# Patient Record
Sex: Male | Born: 1950 | Race: White | Hispanic: No | Smoking: Former smoker
Health system: Southern US, Community
[De-identification: ages and names within clinical notes are randomized; demographics above are authoritative.]

## PROBLEM LIST (undated history)

## (undated) DIAGNOSIS — M549 Dorsalgia, unspecified: Secondary | ICD-10-CM

## (undated) DIAGNOSIS — E785 Hyperlipidemia, unspecified: Secondary | ICD-10-CM

## (undated) DIAGNOSIS — I10 Essential (primary) hypertension: Secondary | ICD-10-CM

## (undated) HISTORY — PX: NECK SURGERY: SHX720

## (undated) HISTORY — PX: SHOULDER ARTHROSCOPY: SHX128

## (undated) HISTORY — PX: SKIN GRAFT: SHX250

## (undated) HISTORY — PX: APPENDECTOMY: SHX54

---

## 2010-08-14 ENCOUNTER — Ambulatory Visit: Payer: Self-pay | Admitting: Family Medicine

## 2010-08-14 DIAGNOSIS — I10 Essential (primary) hypertension: Secondary | ICD-10-CM | POA: Insufficient documentation

## 2010-08-14 DIAGNOSIS — R079 Chest pain, unspecified: Secondary | ICD-10-CM | POA: Insufficient documentation

## 2010-08-14 LAB — CONVERTED CEMR LAB
ALT: 20 units/L (ref 0–53)
Albumin: 3.9 g/dL (ref 3.5–5.2)
CO2: 20 meq/L (ref 19–32)
Calcium: 9 mg/dL (ref 8.4–10.5)
Chloride: 106 meq/L (ref 96–112)
Glucose, Bld: 102 mg/dL — ABNORMAL HIGH (ref 70–99)
Potassium: 4.4 meq/L (ref 3.5–5.3)
Sodium: 138 meq/L (ref 135–145)
Total Protein: 6.3 g/dL (ref 6.0–8.3)

## 2010-08-16 ENCOUNTER — Encounter: Payer: Self-pay | Admitting: Family Medicine

## 2010-09-11 ENCOUNTER — Ambulatory Visit: Payer: Self-pay | Admitting: Emergency Medicine

## 2010-09-11 DIAGNOSIS — B029 Zoster without complications: Secondary | ICD-10-CM | POA: Insufficient documentation

## 2010-09-11 DIAGNOSIS — D492 Neoplasm of unspecified behavior of bone, soft tissue, and skin: Secondary | ICD-10-CM

## 2010-09-14 ENCOUNTER — Ambulatory Visit: Payer: Self-pay | Admitting: Family Medicine

## 2010-09-14 ENCOUNTER — Encounter: Admission: RE | Admit: 2010-09-14 | Discharge: 2010-09-14 | Payer: Self-pay | Admitting: Family Medicine

## 2010-09-14 DIAGNOSIS — M5137 Other intervertebral disc degeneration, lumbosacral region: Secondary | ICD-10-CM

## 2010-09-14 DIAGNOSIS — M5030 Other cervical disc degeneration, unspecified cervical region: Secondary | ICD-10-CM

## 2010-09-14 DIAGNOSIS — R112 Nausea with vomiting, unspecified: Secondary | ICD-10-CM | POA: Insufficient documentation

## 2010-09-14 DIAGNOSIS — R1011 Right upper quadrant pain: Secondary | ICD-10-CM

## 2010-09-15 DIAGNOSIS — N182 Chronic kidney disease, stage 2 (mild): Secondary | ICD-10-CM | POA: Insufficient documentation

## 2010-09-15 LAB — CONVERTED CEMR LAB
AST: 22 units/L (ref 0–37)
Albumin: 4.1 g/dL (ref 3.5–5.2)
Alkaline Phosphatase: 109 units/L (ref 39–117)
BUN: 31 mg/dL — ABNORMAL HIGH (ref 6–23)
Calcium: 9.3 mg/dL (ref 8.4–10.5)
Chloride: 103 meq/L (ref 96–112)
Eosinophils Absolute: 0.1 10*3/uL (ref 0.0–0.7)
Glucose, Bld: 113 mg/dL — ABNORMAL HIGH (ref 70–99)
Lipase: 21 units/L (ref 0–75)
Lymphs Abs: 1.6 10*3/uL (ref 0.7–4.0)
MCV: 92.9 fL (ref 78.0–100.0)
Monocytes Relative: 6 % (ref 3–12)
Neutro Abs: 6.1 10*3/uL (ref 1.7–7.7)
Neutrophils Relative %: 74 % (ref 43–77)
Platelets: 322 10*3/uL (ref 150–400)
Potassium: 4.8 meq/L (ref 3.5–5.3)
RBC: 4.35 M/uL (ref 4.22–5.81)
Sodium: 137 meq/L (ref 135–145)
Total Protein: 6.5 g/dL (ref 6.0–8.3)
WBC: 8.3 10*3/uL (ref 4.0–10.5)

## 2010-09-17 ENCOUNTER — Telehealth (INDEPENDENT_AMBULATORY_CARE_PROVIDER_SITE_OTHER): Payer: Self-pay

## 2010-09-25 ENCOUNTER — Ambulatory Visit: Payer: Self-pay | Admitting: Family Medicine

## 2010-09-25 DIAGNOSIS — T887XXA Unspecified adverse effect of drug or medicament, initial encounter: Secondary | ICD-10-CM | POA: Insufficient documentation

## 2010-09-28 ENCOUNTER — Encounter: Payer: Self-pay | Admitting: Family Medicine

## 2010-11-17 ENCOUNTER — Ambulatory Visit: Payer: Self-pay | Admitting: Family Medicine

## 2011-01-05 ENCOUNTER — Telehealth: Payer: Self-pay | Admitting: Family Medicine

## 2011-01-09 NOTE — Assessment & Plan Note (Signed)
Summary: Followup Call  Discussed lab results with patient.  Reports not much change, but no new symptoms. Advised to finish Valtrex and NSAID. Call for worsening symptoms. Donna Christen MD  August 16, 2010 11:19 AM

## 2011-01-09 NOTE — Assessment & Plan Note (Signed)
Summary: RASH ON L BUTTOCK/WB   Vital Signs:  Patient Profile:   60 Years Old Male CC:      Rash on left buttock x 1 week Height:     68.5 inches O2 Sat:      97 % O2 treatment:    Room Air Temp:     99.0 degrees F oral Pulse rate:   69 / minute Pulse rhythm:   regular Resp:     12 per minute BP sitting:   97 / 60  (left arm) Cuff size:   regular  Vitals Entered By: Emilio Math (September 11, 2010 8:14 AM)                  Current Allergies (reviewed today): No known allergies History of Present Illness Chief Complaint: Rash on left buttock x 1 week History of Present Illness: 1) Rash of L buttock for 1 week, perhaps getting worse last few days.  Was treated 1 month ago for Shingles for R flank.  This rash is slightly painful.  No urinary or bowel problems.  Tender to touch.  Only located on buttock.  No other symptoms. 2) Still c/o R flank swelling, not better s/p Valtrex. He thinks it is getting larger.  Not painful unless you press on it.  No other symptoms, no fever, night sweats, or weight loss.  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  He sees the [facility] but is having a hard time getting appts and things are taking months to happen.  He feels he is getting worse with pain in his neck and both arms.  It has been going on for years.  Current Meds PROSCAR 5 MG TABS (FINASTERIDE)  LISINOPRIL 40 MG TABS (LISINOPRIL)  FLOMAX 0.4 MG CAPS (TAMSULOSIN HCL)  HYDROCODONE-ACETAMINOPHEN 2.5-500 MG TABS (HYDROCODONE-ACETAMINOPHEN) prn BACTRIM DS 800-160 MG TABS (SULFAMETHOXAZOLE-TRIMETHOPRIM) 1 tab by mouth two times a day for 10 days VALTREX 1 GM TABS (VALACYCLOVIR HCL) 1 tab by mouth three times a day for 7 days  REVIEW OF SYSTEMS Constitutional Symptoms      Denies fever, chills, night sweats, weight loss, weight gain, and fatigue.  Eyes       Denies change in vision, eye pain, eye discharge, glasses, contact lenses, and eye  surgery. Ear/Nose/Throat/Mouth       Denies hearing loss/aids, change in hearing, ear pain, ear discharge, dizziness, frequent runny nose, frequent nose bleeds, sinus problems, sore throat, hoarseness, and tooth pain or bleeding.  Respiratory       Denies dry cough, productive cough, wheezing, shortness of breath, asthma, bronchitis, and emphysema/COPD.  Cardiovascular       Denies murmurs, chest pain, and tires easily with exhertion.    Gastrointestinal       Denies stomach pain, nausea/vomiting, diarrhea, constipation, blood in bowel movements, and indigestion. Genitourniary       Denies painful urination, kidney stones, and loss of urinary control. Neurological       Denies paralysis, seizures, and fainting/blackouts. Musculoskeletal       Denies muscle pain, joint pain, joint stiffness, decreased range of motion, redness, swelling, muscle weakness, and gout.  Skin       Denies bruising, unusual mles/lumps or sores, and hair/skin or nail changes.  Psych       Denies mood changes, temper/anger issues, anxiety/stress, speech problems, depression, and sleep problems.  Past History:  Past Medical History:  Reviewed history from 08/14/2010 and no changes required. Hypertension Chronic pain- patient visits pain specialist and receives tri weekly injections starting about 9 weeks ago  Past Surgical History: Reviewed history from 08/14/2010 and no changes required. 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 mildly tender, no fluctuance, mild induration felt, no drainage seen Assessment New Problems: NEOPLASMS UNSPEC NATURE BONE SOFT TISSUE&SKIN (ICD-239.2) HERPES ZOSTER WITHOUT MENTION OF COMPLICATION (ICD-053.9)  1) possible herpes zoster left S2 dermatome 2) lipoma vs spigelian hernia 3) chronic pain  Plan New Medications/Changes: VALTREX 1 GM TABS (VALACYCLOVIR HCL) 1 tab by mouth three times a day for 7 days  #21 x 0, 09/11/2010, Hoyt Koch MD BACTRIM DS 800-160 MG TABS (SULFAMETHOXAZOLE-TRIMETHOPRIM) 1 tab by mouth two times a day for 10 days  #20 x 0, 09/11/2010, Hoyt Koch MD  New Orders: Est. Patient Level III [phone] Planning Comments:   1) Valtrex, continue his current meds for pain.  Added an antibiotic also.  Try sitting on heating pad a couple times a day for a few days.  Follow-up with your primary care physician if not improving or if getting worse  2) refer to general surgery to eval the possible lipoma because it's getting larger 3) refer to PCP since [facility] seems that [facility] and his problems are getting worse.  For his chronic pain, has already had MRI's and EMG/NCV studies which he reports were abnormal.  However was also prev suggested that he see a rheumatologist.  So I'm unsure if he needs rheumatology and/or neurology to further eval his neck and shoulder pain   The patient and/or caregiver has been counseled thoroughly with regard to medications prescribed including dosage, schedule, interactions, rationale for use, and possible side effects and they verbalize understanding.  Diagnoses and expected course of  recovery discussed and will return if not improved as expected or if the condition worsens. Patient and/or caregiver verbalized understanding.  Prescriptions: VALTREX 1 GM TABS (VALACYCLOVIR HCL) 1 tab by mouth three times a day for 7 days  #21 x 0   Entered and Authorized by:   Hoyt Koch MD   Signed by:   Hoyt Koch MD on 09/11/2010   Method used:   Print then Give to Patient   RxID:   [id number] BACTRIM DS 800-160 MG TABS (SULFAMETHOXAZOLE-TRIMETHOPRIM) 1 tab by mouth two times a day for 10 days  #20 x 0   Entered and Authorized by:   Hoyt Koch MD   Signed by:   Hoyt Koch MD on 09/11/2010   Method used:   Print then Give to Patient   RxID:   [id number]   Orders Added: 1)  Est. Patient Level III [40102]

## 2011-01-09 NOTE — Assessment & Plan Note (Signed)
Summary: NOV chronic pain   Vital Signs:  Patient profile:   60 year old male Height:      67 inches Weight:      165 pounds BMI:     25.94 O2 Sat:      94 % on Room air Temp:     98.1 degrees F oral Pulse rate:   77 / minute BP sitting:   95 / 66  (left arm) Cuff size:   regular  Vitals Entered By: [name] (September 14, 2010 9:59 AM)  O2 Flow:  Room air CC: New to est. F/U ED. C/O Arm pain, knot on R side and rash on bottom.   Primary Care Provider:  [name]  CC:  New to est. F/U ED. C/O Arm pain and knot on R side and rash on bottom.Marland Kitchen  History of Present Illness: 60 yo WM presents for NOV.  He has been going to the [facility] mostly in [place].  He has hx of HTN.  He has chronic pain in his neck and back with carpal tunnel syndrome.  He has had  an MRI of his neck and nerve conduction testing.  He went to the pain clinic - [facility]  and had injections done in  the back of his head, neck and lower back but he did not get any pain relief.  He takes Oxycodone but quit everything else.    He has GERD, on Omeprazole.  He had to go the the ED on Monday for the worsening pain in his head, neck, arms and lower back that was not improving with his meds.  He has had extensive back and neck surgery in the past.  Denies any hx of trauma.  He also has HTN and BPH. He c/o Nausea and RUQ with a 'mass' under the skin in the RUQ.     Current Medications (verified): 1)  Proscar 5 Mg Tabs (Finasteride) .... Take 1 Tab By Mouth Once Daily 2)  Lisinopril 40 Mg Tabs (Lisinopril) .... Take 1 Tab By Mouth Once Daily 3)  Flomax 0.4 Mg Caps (Tamsulosin Hcl) 4)  Oxycodone Hcl 5 Mg Tabs (Oxycodone Hcl) .... Take 1 Tab By Mouth As Needed Pain 5)  Promethazine Hcl 25 Mg Tabs (Promethazine Hcl) .... Take As Needed Nausea 6)  Diclofenac Sodium 75 Mg Tbec (Diclofenac Sodium) 7)  Terazosin Hcl 2 Mg Caps (Terazosin Hcl) 8)  Omeprazole 20 Mg Cpdr (Omeprazole) .... Take 1 Tab By Mouth Once  Daily 9)  Fish Oil 1000 Mg Caps (Omega-3 Fatty Acids) 10)  Aspirin 81 Mg Tbec (Aspirin)  Allergies (verified): No Known Drug Allergies  Past History:  Past Medical History: Hypertension Chronic pain- patient visits pain specialist and receives tri weekly injections starting about 9 weeks ago BPH sees the [facility]  Past Surgical History: Reviewed history from 08/14/2010 and no changes required. 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  Review of Systems       no fevers/sweats/+weakness, unexplained wt loss/gain, no change in vision, no difficulty hearing, ringing in ears, no hay fever/allergies, no CP/discomfort, no palpitations, no breast lump/nipple discharge, no cough/wheeze, no blood in stool, no N/V/D, + nocturia, no leaking urine, no unusual vag bleeding, no vaginal/penile discharge, no muscle/joint pain, no rash, no new/changing mole, no HA, no memory loss, no anxiety, no sleep problem, no depression, no  unexplained lumps, no easy bruising/bleeding, no concern with sexual function   Physical Exam  General:  alert and well-hydrated.   Head:  normocephalic and atraumatic.   Eyes:  sclera non icteric Mouth:  poor dentition.   Neck:  globally reduced ROM Lungs:  prolonged exp phase, nonlabored, clear Heart:  normal rate and regular rhythm.   Abdomen:  RUQ tenderness with vol guarding; possible lipoma in the RUQ Msk:  full active L and T spine ROM tender paraspinal muscles Extremities:  no LE or UE edema Neurologic:  gait normal.   Skin:  color normal.  no jaundice Psych:  depressed affect.     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  Will talk to [facility] in [place] about resuming his RX narcotics given his hx.  he has appt with them next wk.  will set him up with chiropractor for  spinal decompression as adjunctive care. Orders: Chiropractic Referral (Chiro)  Problem # 2:  DISC DISEASE, LUMBAR (ICD-722.52) As per #1.  Will work on keeping him out of the ED for pain meds. Orders: Chiropractic Referral (Chiro)  Problem # 3:  RUQ PAIN (ICD-789.01) Possible painful lipoma vs cholecystitis, hepatitis or pancreatitis given symptom of nausea.  Will get RUQ u/s and labs today. Orders: T-Ultrasound Abdominal, Complete (16109) T-Comprehensive Metabolic Panel ([id number])  Problem # 4:  NAUSEA WITH VOMITING (ICD-787.01) Labs today.  Will need to see his old records and GI notes. Orders: T-CBC w/Diff [id number]) T-Lipase [id number]) T-Amylase [id number])  Complete Medication List: 1)  Proscar 5 Mg Tabs (Finasteride) .... Take 1 tab by mouth once daily 2)  Lisinopril 40 Mg Tabs (Lisinopril) .... 1/2 tab by mouth daily 3)  Flomax 0.4 Mg Caps (Tamsulosin hcl) 4)  Oxycodone Hcl 5 Mg Tabs (Oxycodone hcl) .... Take 1 tab by mouth as needed pain 5)  Promethazine Hcl 25 Mg Tabs (Promethazine hcl) .... Take as needed nausea 6)  Diclofenac Sodium 75 Mg Tbec (Diclofenac sodium) 7)  Terazosin Hcl 2 Mg Caps (Terazosin hcl) 8)  Omeprazole 20 Mg Cpdr (Omeprazole) .... Take 1 tab by mouth once daily 9)  Fish Oil 1000 Mg Caps (Omega-3 fatty acids) 10)  Aspirin 81 Mg Tbec (Aspirin)  Patient Instructions: 1)  Start on Cymbalta for both mood and chronic pain-- 30 mg once daily x 1 wk then go up to 60 mg/ day.  Call if any problems. 2)  Stay on current pain meds. 3)  I will get you into your pain clinic for med managment. 4)  Cut your Lisinopril in 1/2.  Your BP is a little too low. 5)  Referral made to Dr Laurine Blazer for spinal decompression. 6)  RUQ u/s order for RUQ pain. 7)  Labs downstairs today. 8)  Will call you w/ all results. 9)  Return for f/u in 1 month.  Appended Document: NOV chronic pain append:  added Cymbalta 30 mg/ day x 7 days then increase to 60 mg/ day as  an adjunct for chronic pain syndrome.  Seymour Bars, D.O.

## 2011-01-09 NOTE — Assessment & Plan Note (Signed)
Summary: rash/ throat swelling   Vital Signs:  Patient profile:   60 year old Kenneth Gray Height:      67 inches Weight:      167 pounds BMI:     26.25 O2 Sat:      96 % on Room air Pulse rate:   89 / minute BP sitting:   112 / 80  (left arm) Cuff size:   regular  Vitals Entered By: [name] (September 25, 2010 1:21 PM)  O2 Flow:  Room air CC: Rash on arms x 1 week. Burns and itches.    Primary Care [name]:  [name]  CC:  Rash on arms x 1 week. Burns and itches. .  History of Present Illness: 60 yo WM presents for an itchy rash on his arms x 1 wk.  He has tried oral benadryl and topical cortisone but it's not helping.  He feels like his throat is 'swollen'.  He was started on Cymbalta recently.  This is the only new medicine.  He has been to the pain clinic since our last visit.  No new meds were added.  he told me that they wanted him to see psych.    Allergies (verified): 1)  ! Cymbalta (Duloxetine Hcl)  Past History:  Past Medical History: Reviewed history from 09/14/2010 and no changes required. Hypertension Chronic pain- patient visits pain specialist and receives tri weekly injections starting about 9 weeks ago BPH sees the [place]  Social History: Reviewed history from 09/14/2010 and no changes required. 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   Mouth:  able to swallow.  No tongue edema.   Lungs:  no wheezing Heart:  normal rate, regular rhythm, and no murmur.   Extremities:  no UE or LE edema Skin:  excoriations with purpura along forearms Cervical Nodes:  No lymphadenopathy noted Psych:  flat affect.     Impression & Recommendations:  Problem # 1:  ADVERSE DRUG REACTION (ICD-995.20) Probable adverse reaction to Cymbalta start with sensation of throat swelling, pruritis  and rash. Wean off cymbalta - 30 mg caps given, take one a day x 3 days then stop. Treat itchy rash on arms with triamcinolone ointment x 10 days. Avoid scratching.  Take Benadryl 25 mg q 6 hrs x 5 days. f/u with psych.  Complete Medication List: 1)  Proscar 5 Mg Tabs (Finasteride) .... Take 1 tab by mouth once daily 2)  Lisinopril 40 Mg Tabs (Lisinopril) .... 1/2 tab by mouth daily 3)  Flomax 0.4 Mg Caps (Tamsulosin hcl) 4)  Oxycodone Hcl 5 Mg Tabs (Oxycodone hcl) .... Take 1 tab by mouth as needed pain 5)  Promethazine Hcl 25 Mg Tabs (Promethazine hcl) .... Take as needed nausea 6)  Diclofenac Sodium 75 Mg Tbec (Diclofenac sodium) 7)  Terazosin Hcl 2 Mg Caps (Terazosin hcl) 8)  Omeprazole 20 Mg Cpdr (Omeprazole) .... Take 1 tab by mouth once daily 9)  Fish Oil 1000 Mg Caps (Omega-3 fatty acids) 10)  Aspirin 81 Mg Tbec (Aspirin) 11)  Triamcinolone Acetonide 0.1 % Oint (Triamcinolone acetonide) .... Apply to rash on forearms two times a day x 7 days  Patient Instructions: 1)  Wean off Cymbalta - take 30 mg once daily x 3 days then STOP. 2)  Use OTC Benadryl 25 mg every  6 hrs x 5 days for allergic reaction. 3)  Use Triamcinolone ointment on forearms 2 x a day x 7-10 days.   4)  Make appt with psychiatry. 5)  Call if throat irritation and rash has not improved in 10 days. Prescriptions: TRIAMCINOLONE ACETONIDE 0.1 % OINT (TRIAMCINOLONE ACETONIDE) apply to rash on forearms two times a day x 7 days  #1 tube x 0   Entered and Authorized by:   [name]   Signed by:   [name] on 09/25/2010   Method used:   Electronically to        [facility] [phone]* (retail)       [address] [place], [place]  [address]       Ph: [phone]       Fax: [phone]   RxID:   [id number]    Orders Added: 1)  Est. Patient Level III [56433]

## 2011-01-09 NOTE — Assessment & Plan Note (Signed)
Summary: f/u bP   Vital Signs:  Patient profile:   60 year old male Height:      67 inches Weight:      179 pounds BMI:     28.14 Pulse rate:   72 / minute BP sitting:   106 / 64  (left arm) Cuff size:   regular  Vitals Entered By: [name] (November 17, 2010 8:15 AM) CC: F/U BP. Increased lisinopril to full tab   Primary Care Provider:  [name]  CC:  F/U BP. Increased lisinopril to full tab.  History of Present Illness: 60 yo WM presents for f/u HTN.  his last Cr was 1.5 and he did see Dr Charlies Silvers and had a renal u/s done.  He did not have any obstruction.  He did stop his Diclofenac.  He is seeing pain mgmt and is looking into getting a tens unit.  His Oxycodone was increased.  He reallizes that he cannot go back to work.  He is looking into getting a nerve stimulator.  He was monitoring BPs at home on 1/2 tab of Lisinopril and it was 150/90, so he went up to a full 40 mg tab.  Denies CP or DOE.  Denies leg edema or blurry vision.  He is seeing a chiropractor which has helped and is looking into adding massage and accupuncture.  Current Medications (verified): 1)  Proscar 5 Mg Tabs (Finasteride) .... Take 1 Tab By Mouth Once Daily 2)  Lisinopril 40 Mg Tabs (Lisinopril) .... Take 1 Tab By Mouth Once Daily 3)  Flomax 0.4 Mg Caps (Tamsulosin Hcl) 4)  Oxycodone Hcl 5 Mg Tabs (Oxycodone Hcl) .... Take 1 Tab By Mouth As Needed Pain 5)  Promethazine Hcl 25 Mg Tabs (Promethazine Hcl) .... Take As Needed Nausea 6)  Omeprazole 20 Mg Cpdr (Omeprazole) .... Take 1 Tab By Mouth Once Daily 7)  Fish Oil 1000 Mg Caps (Omega-3 Fatty Acids) 8)  Aspirin 81 Mg Tbec (Aspirin)  Allergies (verified): 1)  ! Cymbalta (Duloxetine Hcl)  Past History:  Past Medical History: Reviewed history from 09/14/2010 and no changes required. Hypertension Chronic pain- patient visits pain specialist and receives tri weekly injections starting about 9 weeks ago BPH sees the [place]  Past  Surgical History: Reviewed history from 08/14/2010 and no changes required. 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   Head:  normocephalic and atraumatic.   Eyes:  pupils equal, pupils round, and pupils reactive to light.   Mouth:  pharynx pink and moist.   Neck:  no masses.   Lungs:  Normal respiratory effort, chest expands symmetrically. Lungs are clear to auscultation, no crackles or wheezes. Heart:  normal rate, regular rhythm, and no murmur.   Extremities:  no LE edema Neurologic:  gait normal.   Skin:  color normal.   Psych:  good eye contact, not anxious appearing, and not depressed appearing.     Impression & Recommendations:  Problem # 1:  HYPERTENSION (ICD-401.9) BP looks good on Lisinopril.  Will continue.  Plan recheck BMP in 2 mos (he has appt with Dr Charlies Silvers in 2 mos). The following medications were removed from the medication list:    Terazosin  Hcl 2 Mg Caps (Terazosin hcl) His updated medication list for this problem includes:    Lisinopril 40 Mg Tabs (Lisinopril) .Marland Kitchen... 1 tab by mouth daily  BP today: 106/64 Prior BP: 112/80 (09/25/2010)  Labs Reviewed: K+: 4.8 (09/14/2010) Creat: : 1.53 (09/14/2010)     Problem # 2:  CHRONIC KIDNEY DISEASE STAGE II (MILD) (ICD-585.2) I reviewed his notes from Dr Charlies Silvers.  He is off NSAIDs but is now back on ACEi.  Will recheck his BMP in 8 wks.    Complete Medication List: 1)  Proscar 5 Mg Tabs (Finasteride) .... Take 1 tab by mouth once daily 2)  Lisinopril 40 Mg Tabs (Lisinopril) .Marland Kitchen.. 1 tab by mouth daily 3)  Flomax 0.4 Mg Caps (Tamsulosin hcl) 4)  Oxycodone Hcl 5 Mg Tabs (Oxycodone hcl) .... Take 1 tab  by mouth as needed pain 5)  Promethazine Hcl 25 Mg Tabs (Promethazine hcl) .... Take as needed nausea 6)  Omeprazole 20 Mg Cpdr (Omeprazole) .... Take 1 tab by mouth once daily 7)  Fish Oil 1000 Mg Caps (Omega-3 fatty acids) 8)  Aspirin 81 Mg Tbec (Aspirin) Prescriptions: LISINOPRIL 40 MG TABS (LISINOPRIL) 1 tab by mouth daily  #30 x 5   Entered and Authorized by:   [name]   Signed by:   [name] on 11/17/2010   Method used:   Electronically to        [facility] [phone]* (retail)       [address] [place], [place]  [address]       Ph: [phone]       Fax: [phone]   RxID:   [id number]    Orders Added: 1)  Est. Patient Level III [24401]  Appended Document: f/u bP    Patient Instructions: 1)  Stay on Lisinopril 40 mg/ day. 2)  Plan to recheck Renal function in 2 mos -- Dr Charlies Silvers can do this at your f/u visit. 3)  Call if you want to see ortho for your wrist.

## 2011-01-09 NOTE — Consult Note (Signed)
[facility]   Imported By: Lanelle Bal 10/13/2010 11:05:48  _____________________________________________________________________  External Attachment:    Type:   Image     Comment:   External Document

## 2011-01-09 NOTE — Assessment & Plan Note (Signed)
Summary: LUMP ON RIGHT SIDE AT WAIST/TJ (4)   Vital Signs:  Patient Profile:   60 Years Old Male CC:      knot to right side x 3 days Height:     68.5 inches Weight:      170 pounds O2 Sat:      98 % O2 treatment:    Room Air Temp:     98.7 degrees F oral Pulse rate:   68 / minute Resp:     14 per minute BP sitting:   103 / 71  (left arm) Cuff size:   regular  Pt. in pain?   yes    Location:   right side    Intensity:   5    Type:       dull  Vitals Entered By: Lajean Saver RN (August 14, 2010 12:24 PM)                   Updated Prior Medication List: PROSCAR 5 MG TABS (FINASTERIDE)  LISINOPRIL 40 MG TABS (LISINOPRIL)  FLOMAX 0.4 MG CAPS (TAMSULOSIN HCL)  HYDROCODONE-ACETAMINOPHEN 2.5-500 MG TABS (HYDROCODONE-ACETAMINOPHEN) prn  Current Allergies: No known allergies History of Present Illness Chief Complaint: knot to right side x 3 days History of Present Illness:  Subjective:  Patient complains of 3 day history of vague pain in right lower anterior chest, somewhat worse with movement.  He believes that he may be developing a faint "measles-like" rash in the area.  No cough.  No nausea/vomiting or indigestion.  No urinary symptoms.  No fevers, chills, and sweats.  No known trauma to the area. He states that he quit smoking 20 years ago.  REVIEW OF SYSTEMS Constitutional Symptoms      Denies fever, chills, night sweats, weight loss, weight gain, and fatigue.  Eyes       Denies change in vision, eye pain, eye discharge, glasses, contact lenses, and eye surgery. Ear/Nose/Throat/Mouth       Denies hearing loss/aids, change in hearing, ear pain, ear discharge, dizziness, frequent runny nose, frequent nose bleeds, sinus problems, sore throat, hoarseness, and tooth pain or bleeding.  Respiratory       Denies dry cough, productive cough, wheezing, shortness of breath, asthma, bronchitis, and emphysema/COPD.  Cardiovascular       Denies murmurs, chest pain, and tires  easily with exhertion.    Gastrointestinal       Denies stomach pain, nausea/vomiting, diarrhea, constipation, blood in bowel movements, and indigestion. Genitourniary       Denies painful urination, kidney stones, and loss of urinary control. Neurological       Denies paralysis, seizures, and fainting/blackouts. Musculoskeletal       Denies muscle pain, joint pain, joint stiffness, decreased range of motion, redness, swelling, muscle weakness, and gout.  Skin       Complains of unusual moles/lumps or sores.      Denies bruising and hair/skin or nail changes.  Psych       Denies mood changes, temper/anger issues, anxiety/stress, speech problems, depression, and sleep problems. Other Comments: Patient has a knot about 2 -3 inches wide sub dermal that began on friday. It is painful with movement and to touch. No redness or breakage of the skin.   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  Extraocular movement is intact.  Conjunctivae are not inflamed.  Pharynx:  Normal  Neck:  Supple.  No adenopathy is present.  No thyromegaly is present  Lungs:  Clear to auscultation.  Breath sounds are equal.  Chest:  vague diffuse tenderness right anterior/inferior ribs. Abdomen:   Vague mild tenderness beneath right costal margin without masses or hepatosplenomegaly.  Bowel sounds are present.  No CVA or flank tenderness.  Skin:  Very faint reticular erythema in a dermatomal pattern over right anterior/inferior  ribs. Extremities:  No edema.   CBC:  WBC 7.3; Hgb 13.0 Chest X-ray with rib detail negative Assessment New Problems: RIB PAIN, RIGHT SIDED (ICD-786.50) HYPERTENSION (ICD-401.9)  SUSPECT EARLY HERPES ZOSTER  Plan New Medications/Changes: VALTREX 1 GM TABS (VALACYCLOVIR HCL) One by mouth q8hr for shingles  #21 x 0, 08/14/2010, Donna Christen MD  New Orders: T-Comprehensive Metabolic Panel [phone] CBC w/Diff [[id number]] T-DG Ribs Unilateral w/Chest*R* [71101] New Patient Level III [99203] Planning Comments:   Begin Valtrex.  Patient has prescription  ibuprofen at home:  advised to begin taking as prescribed. CMP Pending. Follow-up with PCP if not improved. Given a Water quality scientist patient information and instruction sheet on topic shingles   The patient and/or caregiver has been counseled thoroughly with regard to medications prescribed including dosage, schedule, interactions, rationale for use, and possible side effects and they verbalize understanding.  Diagnoses and expected course of recovery discussed and will return if not improved as expected or if the condition worsens. Patient and/or caregiver verbalized understanding.  Prescriptions: VALTREX 1 GM TABS (VALACYCLOVIR HCL) One by mouth q8hr for shingles  #21 x 0   Entered and Authorized by:   Donna Christen MD   Signed by:   Donna Christen MD on 08/14/2010   Method used:   Print then Give to Patient   RxID:   [id number]   Orders Added: 1)  T-Comprehensive Metabolic Panel [[id number]] 2)  CBC w/Diff [[id number]] 3)  T-DG Ribs Unilateral w/Chest*R* [71101] 4)  New Patient Level III [51884]

## 2011-01-09 NOTE — Progress Notes (Signed)
  Phone Note Outgoing Call   Call placed by: Areta Haber CMA,  September 17, 2010 2:42 PM Call placed to: Patient Summary of Call: Courtesy call made to pt - Pt states everything is same, still trying to find out what rash is. Pt states he's now seeing Dr. Cathey Endow as his PCP and will follow up as directed with her. Initial call taken by: Areta Haber CMA,  September 17, 2010 2:44 PM

## 2011-01-11 NOTE — Progress Notes (Signed)
Summary: Pt. request for ortho referral   Phone Note Call from Patient   Caller: Patient Summary of Call: Patient states that he just left the Chiropractor and he felt like he needed to go ahead ask for a ortho referral  for his wrist/arm pain... call him and let him know the status on this at [phone]... Thanks.Michaelle Copas  January 05, 2011 12:27 PM  Initial call taken by: Michaelle Copas,  January 05, 2011 12:27 PM  Follow-up for Phone Call        done Follow-up by: [name],  January 05, 2011 12:29 PM

## 2011-02-12 ENCOUNTER — Ambulatory Visit
Admission: RE | Admit: 2011-02-12 | Discharge: 2011-02-12 | Disposition: A | Discharge status: Home / Self Care | Payer: Self-pay | Source: Ambulatory Visit | Attending: Orthopedic Surgery | Admitting: Orthopedic Surgery

## 2011-02-12 ENCOUNTER — Other Ambulatory Visit: Payer: Self-pay | Admitting: Orthopedic Surgery

## 2011-02-12 DIAGNOSIS — M542 Cervicalgia: Secondary | ICD-10-CM

## 2011-02-12 DIAGNOSIS — M545 Low back pain: Secondary | ICD-10-CM

## 2011-02-15 ENCOUNTER — Other Ambulatory Visit: Payer: Self-pay | Admitting: Orthopedic Surgery

## 2011-02-15 DIAGNOSIS — M542 Cervicalgia: Secondary | ICD-10-CM

## 2011-03-12 ENCOUNTER — Ambulatory Visit
Admission: RE | Admit: 2011-03-12 | Discharge: 2011-03-12 | Disposition: A | Discharge status: Home / Self Care | Payer: Managed Care, Other (non HMO) | Source: Ambulatory Visit | Attending: Orthopedic Surgery | Admitting: Orthopedic Surgery

## 2011-03-12 DIAGNOSIS — M542 Cervicalgia: Secondary | ICD-10-CM

## 2011-03-12 MED ORDER — GADOBENATE DIMEGLUMINE 529 MG/ML IV SOLN
17.0000 mL | Freq: Once | INTRAVENOUS | Status: AC | PRN
  Administered 2011-03-12: 17 mL via INTRAVENOUS

## 2011-10-04 ENCOUNTER — Encounter: Payer: Self-pay | Admitting: Emergency Medicine

## 2011-10-04 ENCOUNTER — Inpatient Hospital Stay (INDEPENDENT_AMBULATORY_CARE_PROVIDER_SITE_OTHER)
Admission: RE | Admit: 2011-10-04 | Discharge: 2011-10-04 | Disposition: A | Discharge status: Home / Self Care | Payer: Managed Care, Other (non HMO) | Source: Ambulatory Visit | Attending: Emergency Medicine | Admitting: Emergency Medicine

## 2011-10-04 DIAGNOSIS — T887XXA Unspecified adverse effect of drug or medicament, initial encounter: Secondary | ICD-10-CM

## 2011-10-04 DIAGNOSIS — L255 Unspecified contact dermatitis due to plants, except food: Secondary | ICD-10-CM | POA: Insufficient documentation

## 2011-11-12 NOTE — Progress Notes (Signed)
Summary: swollen and broken out Room 5   Vital Signs:  Patient Profile:   60 Years Old Male CC:      Red, itching, swelling, face- rash on rt forearmx 3 days Height:     67 inches Weight:      183 pounds O2 Sat:      97 % O2 treatment:    Room Air Temp:     97.9 degrees F oral Pulse rate:   71 / minute Pulse rhythm:   regular Resp:     16 per minute BP sitting:   114 / 77  (left arm) Cuff size:   regular  Vitals Entered By: Emilio Math (October 04, 2011 9:38 AM)                  Current Allergies (reviewed today): ! CYMBALTA (DULOXETINE HCL)History of Present Illness Chief Complaint: Red, itching, swelling, face- rash on rt forearmx 3 days History of Present Illness: Itching arms, torso, face for a few days.  Area on R arm as well that is raised.  Swelling on face.  He was out working in his yard last weekend.  No F/C or other symptoms.  Benedryl helps.   Warm shower made it feel better.  Current Meds PROSCAR 5 MG TABS (FINASTERIDE) Take 1 tab by mouth once daily LISINOPRIL 40 MG TABS (LISINOPRIL) 1 tab by mouth daily FLOMAX 0.4 MG CAPS (TAMSULOSIN HCL)  OXYCODONE HCL 5 MG TABS (OXYCODONE HCL) Take 1 tab by mouth as needed pain PROMETHAZINE HCL 25 MG TABS (PROMETHAZINE HCL) Take as needed nausea OMEPRAZOLE 20 MG CPDR (OMEPRAZOLE) Take 1 tab by mouth once daily FISH OIL 1000 MG CAPS (OMEGA-3 FATTY ACIDS)  ASPIRIN 81 MG TBEC (ASPIRIN)  DIPHENHYDRAMINE HCL 25 MG CAPS (DIPHENHYDRAMINE HCL)  PREDNISONE (PAK) 10 MG TABS (PREDNISONE) 6 day pack, use as directed DOXYCYCLINE HYCLATE 100 MG CAPS (DOXYCYCLINE HYCLATE) 1 by mouth two times a day for 7 days  REVIEW OF SYSTEMS Constitutional Symptoms      Denies fever, chills, night sweats, weight loss, weight gain, and fatigue.  Eyes       Denies change in vision, eye pain, eye discharge, glasses, contact lenses, and eye surgery. Ear/Nose/Throat/Mouth       Denies hearing loss/aids, change in hearing, ear pain, ear discharge,  dizziness, frequent runny nose, frequent nose bleeds, sinus problems, sore throat, hoarseness, and tooth pain or bleeding.  Respiratory       Denies dry cough, productive cough, wheezing, shortness of breath, asthma, bronchitis, and emphysema/COPD.  Cardiovascular       Denies murmurs, chest pain, and tires easily with exhertion.    Gastrointestinal       Denies stomach pain, nausea/vomiting, diarrhea, constipation, blood in bowel movements, and indigestion. Genitourniary       Denies painful urination, kidney stones, and loss of urinary control. Neurological       Denies paralysis, seizures, and fainting/blackouts. Musculoskeletal       Denies muscle pain, joint pain, joint stiffness, decreased range of motion, redness, swelling, muscle weakness, and gout.  Skin       Denies bruising, unusual mles/lumps or sores, and hair/skin or nail changes.      Comments: face swelling red, itching Psych       Denies mood changes, temper/anger issues, anxiety/stress, speech problems, depression, and sleep problems.  Past History:  Past Medical History: Reviewed history from 09/14/2010 and no changes required. Hypertension Chronic pain- patient visits pain specialist and  receives tri weekly injections starting about 9 weeks ago BPH sees the [place]  Past Surgical History: Reviewed history from 08/14/2010 and no changes required. 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 Mother, d, Old age Father, D, Heart disease, stroke  Social History: Reviewed history from 09/14/2010 and no changes required. 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  Swelling around eyes and face.  OP patent / clear.  CTAB. Assessment New Problems: RHUS DERMATITIS  (ICD-692.6)   Plan New Medications/Changes: DOXYCYCLINE HYCLATE 100 MG CAPS (DOXYCYCLINE HYCLATE) 1 by mouth two times a day for 7 days  #14 x 0, 10/04/2011, Hoyt Koch MD PREDNISONE (PAK) 10 MG TABS (PREDNISONE) 6 day pack, use as directed  #1 x 0, 10/04/2011, Hoyt Koch MD  New Orders: Solumedrol up to 125mg  [J2930] Est. Patient Level IV [16109] Admin of Therapeutic Inj  intramuscular or subcutaneous [96372] Planning Comments:   Solumedrol 125 IM given in clinic, then start prednisone tomorrow.  Gave Rx for Doxy b/c he is concerned with infection, but told to hold a few days.  Cool compresses, Benedryl, hydration, avoid further irritation/scratching.  Follow-up with your primary care physician if not improving or if getting worse   The patient and/or caregiver has been counseled thoroughly with regard to medications prescribed including dosage, schedule, interactions, rationale for use, and possible side effects and they verbalize understanding.  Diagnoses and expected course of recovery discussed and will return if not improved as expected or if the condition worsens. Patient and/or caregiver verbalized understanding.  Prescriptions: DOXYCYCLINE HYCLATE 100 MG CAPS (DOXYCYCLINE HYCLATE) 1 by mouth two times a day for 7 days  #14 x 0   Entered and Authorized by:   Hoyt Koch MD   Signed by:   Hoyt Koch MD on 10/04/2011   Method used:   Print then Give to Patient   RxID:   [id number] PREDNISONE (PAK) 10 MG TABS (PREDNISONE) 6 day pack, use as directed  #1 x 0   Entered and Authorized by:   Hoyt Koch MD   Signed by:   Hoyt Koch MD on 10/04/2011   Method used:   Print then Give to Patient   RxID:   [id number]   Medication Administration  Injection # 1:    Medication: Solumedrol up to 125mg     Diagnosis: ADVERSE DRUG REACTION (ICD-995.20)    Route: IM    Site: LUOQ gluteus    Exp Date: 04/09/2014    Lot #: [id number]    Mfr:  Pf[employer]    Comments: 125 Mgs given    Patient tolerated injection without complications    Given by: Emilio Math (October 04, 2011 10:29 AM)  Orders Added: 1)  Solumedrol up to 125mg  [J2930] 2)  Est. Patient Level IV [13244] 3)  Admin of Therapeutic Inj  intramuscular or subcutaneous [96372]     Medication Administration  Injection # 1:    Medication: Solumedrol up to 125mg     Diagnosis: ADVERSE DRUG REACTION (ICD-995.20)    Route: IM    Site: LUOQ gluteus    Exp Date: 04/09/2014    Lot #: [id number]    Mfr: Pfizer    Comments: 125 Mgs given    Patient tolerated injection without complications    Given by: Emilio Math (October 04, 2011  10:29 AM)  Orders Added: 1)  Solumedrol up to 125mg  [J2930] 2)  Est. Patient Level IV [04540] 3)  Admin of Therapeutic Inj  intramuscular or subcutaneous [98119]

## 2011-11-20 ENCOUNTER — Emergency Department
Admission: EM | Admit: 2011-11-20 | Discharge: 2011-11-20 | Disposition: A | Discharge status: Home / Self Care | Payer: Managed Care, Other (non HMO) | Source: Home / Self Care | Attending: Emergency Medicine | Admitting: Emergency Medicine

## 2011-11-20 ENCOUNTER — Emergency Department
Admit: 2011-11-20 | Discharge: 2011-11-20 | Disposition: A | Discharge status: Home / Self Care | Payer: Managed Care, Other (non HMO)

## 2011-11-20 DIAGNOSIS — L0291 Cutaneous abscess, unspecified: Secondary | ICD-10-CM

## 2011-11-20 DIAGNOSIS — M25539 Pain in unspecified wrist: Secondary | ICD-10-CM

## 2011-11-20 DIAGNOSIS — L0390 Cellulitis, unspecified: Secondary | ICD-10-CM

## 2011-11-20 DIAGNOSIS — M25531 Pain in right wrist: Secondary | ICD-10-CM

## 2011-11-20 HISTORY — DX: Dorsalgia, unspecified: M54.9

## 2011-11-20 MED ORDER — DOXYCYCLINE HYCLATE 100 MG PO CAPS: 100.0000 mg | ORAL_CAPSULE | Freq: Two times a day (BID) | ORAL | Status: AC

## 2011-11-20 MED ORDER — CIPROFLOXACIN HCL 500 MG PO TABS: 500.0000 mg | ORAL_TABLET | Freq: Two times a day (BID) | ORAL | Status: AC

## 2011-11-20 NOTE — ED Provider Notes (Addendum)
History     CSN: [id number] Arrival date & time: 11/20/2011  6:50 PM   First MD Initiated Contact with Patient 11/20/11 1833      Chief Complaint  Patient presents with  . Hand Injury    (Consider location/radiation/quality/duration/timing/severity/associated sxs/prior treatment) HPI This is a right-handed male who is on disability for back pain and therefore is currently unemployed. He woke up 2 days ago with right wrist pain. He does not recall any type of trauma or injury. He does not recall any insect bites. It started on the dorsal aspect of the right wrist as a bump and has been worsening in terms of swelling and redness. He states that it is painful, and has been using heat and ice which have not been helping much. He does not report any fever or chills or other constitutional symptoms. No previous wrist injuries. He has no other joints that have been swollen or red recently. There is no family history of any rheumatologic disorders.  Past Medical History  Diagnosis Date  . Back pain     History reviewed. No pertinent past surgical history.  History reviewed. No pertinent family history.  History  Substance Use Topics  . Smoking status: Never Smoker   . Smokeless tobacco: Not on file  . Alcohol Use: Yes      Review of Systems  Allergies  Duloxetine  Home Medications   Current Outpatient Rx  Name Route Sig Dispense Refill  . ASPIRIN 81 MG PO TABS Oral Take 81 mg by mouth daily.      Marland Kitchen FINASTERIDE 5 MG PO TABS Oral Take 5 mg by mouth daily.      Marland Kitchen LISINOPRIL 10 MG PO TABS Oral Take 40 mg by mouth daily.     Marland Kitchen CIPROFLOXACIN HCL 500 MG PO TABS Oral Take 1 tablet (500 mg total) by mouth 2 (two) times daily. 20 tablet 0  . DOXYCYCLINE HYCLATE 100 MG PO CAPS Oral Take 1 capsule (100 mg total) by mouth 2 (two) times daily. 20 capsule 0    BP 116/79  Pulse 69  Temp(Src) 98.3 F (36.8 C) (Oral)  Resp 18  Ht 5\' 7"  (1.702 m)  Wt 187 lb (84.823 kg)  BMI 29.29  kg/m2  SpO2 96%  Physical Exam  Nursing note and vitals reviewed. Constitutional: He is oriented to person, place, and time. He appears well-developed and well-nourished.  HENT:  Head: Normocephalic and atraumatic.  Eyes: No scleral icterus.  Neck: Neck supple.  Cardiovascular: Regular rhythm and normal heart sounds.   Pulmonary/Chest: Effort normal and breath sounds normal. No respiratory distress.  Musculoskeletal:       Right wrist examination shows normal range of motion with flexion, extension, supination and pronation. On the dorsal aspect of his wrist he has an area which appears that may be consistent with an insect bite and is slightly raised with a surrounding erythema and swelling from his metacarpal phalangeal joints to approximately 6 cm proximal to his actual wrist joint. He has tenderness throughout this area. There is no volar tenderness or swelling.  The distal neurovascular status is intact in the rest of his hand and fingers.   Neurological: He is alert and oriented to person, place, and time.  Skin: Skin is warm and dry.  Psychiatric: He has a normal mood and affect. His speech is normal.    ED Course  Procedures (including critical care time)  Labs Reviewed - No data to display Dg Wrist Complete  Right  11/20/2011  *RADIOLOGY REPORT*  Clinical Data: Right wrist pain and swelling.  Question of infection.  History of carpal tunnel surgery in the 1980s.  RIGHT WRIST - COMPLETE 3+ VIEW  Comparison: 02/12/2011  Findings: There is mild joint space narrowing of the first carpometacarpal joint.  There is no evidence for acute fracture or dislocation.  No evidence for osseous erosion, soft tissue gas, or radiopaque foreign body.  IMPRESSION:  1. No evidence for acute  abnormality. 2.  If there is strong suspicion for infection, consider MRI with contrast.  Original Report Authenticated By: Patterson Hammersmith, M.D.     1. Right wrist pain   2. Cellulitis       MDM  An  x-ray is obtained to rule out any kind of bony pathology which is read as above.  Encourage rest, ice, compression with ACE bandage and/or a brace, and elevation of injured body part. Differential diagnosis includes cellulitis versus septic arthritis versus sprain/swelling.  Will place him on a combination of Cipro and and doxycycline to cover MRSA. If he is not improving in 1-1.5 days at the very most, or if he is getting worse in the meantime despite treatment, we'll have to send him to orthopedics and they may have to tap the joint and/or get a MRI with contrast to evaluate for possible infection/osteomyelitis. At this time, I feel that this is a superficial cellulitis possibly from a insect bite.  I do not feel that it is an intra-articular infection since the symptoms are fairly localized to the dorsal aspect and there are no constitutional symptoms, but it does remain important diagnosis to consider.  I discussed with the patient these possibilities and he understands. We have called his prescriptions into his pharmacy so that he can start  them tonight.     Lily Kocher, MD 11/20/11 1946  Another item in the differential diagnosis includes gout. For this I suggested the use of ibuprofen.  Lily Kocher, MD 11/20/11 1949

## 2011-11-20 NOTE — ED Notes (Signed)
X one week ago after waking up noticed pain and swelling to right hand

## 2011-11-21 ENCOUNTER — Telehealth: Payer: Self-pay | Admitting: Emergency Medicine

## 2011-12-31 ENCOUNTER — Other Ambulatory Visit: Payer: Self-pay | Admitting: Orthopedic Surgery

## 2011-12-31 ENCOUNTER — Ambulatory Visit
Admission: RE | Admit: 2011-12-31 | Discharge: 2011-12-31 | Disposition: A | Discharge status: Home / Self Care | Payer: Managed Care, Other (non HMO) | Source: Ambulatory Visit | Attending: Orthopedic Surgery | Admitting: Orthopedic Surgery

## 2011-12-31 DIAGNOSIS — R52 Pain, unspecified: Secondary | ICD-10-CM

## 2012-03-13 ENCOUNTER — Emergency Department (INDEPENDENT_AMBULATORY_CARE_PROVIDER_SITE_OTHER)
Admission: EM | Admit: 2012-03-13 | Discharge: 2012-03-13 | Disposition: A | Discharge status: Home / Self Care | Payer: Managed Care, Other (non HMO) | Source: Home / Self Care | Attending: Emergency Medicine | Admitting: Emergency Medicine

## 2012-03-13 ENCOUNTER — Encounter: Payer: Self-pay | Admitting: *Deleted

## 2012-03-13 DIAGNOSIS — L259 Unspecified contact dermatitis, unspecified cause: Secondary | ICD-10-CM

## 2012-03-13 MED ORDER — METHYLPREDNISOLONE SODIUM SUCC 125 MG IJ SOLR
125.0000 mg | Freq: Once | INTRAMUSCULAR | Status: AC
  Administered 2012-03-13: 125 mg via INTRAMUSCULAR

## 2012-03-13 MED ORDER — PREDNISONE (PAK) 10 MG PO TABS: 10.0000 mg | ORAL_TABLET | Freq: Every day | ORAL | Status: AC

## 2012-03-13 NOTE — ED Provider Notes (Signed)
History     CSN: [id number]  Arrival date & time 03/13/12  1859   First MD Initiated Contact with Patient 03/13/12 1916      Chief Complaint  Patient presents with  . Rash    (Consider location/radiation/quality/duration/timing/severity/associated sxs/prior treatment) Patient is a 61 y.o. male presenting with rash.  Rash    this patient was outside in his yard yesterday doing some work and cutting down some bushes.  He states that he was wearing gloves and long sweatshirt but last night in the middle of the night he broke out in a itchy rash.  It started on his hands and arms but is now also on his trunk and back.  He was here about 6 months ago with a similar rash after contacting poison ivy and improved with prednisone.  No new soaps, shampoos, detergents, new clothes, recent travel, tick bites, animal exposure.  Benadryl 25 mg by mouth today helped the itching. Feeling ill: no  Fever: no  Facial/tongue swelling/difficulty breathing:  no  Diabetic or immunocompromised: no     Past Medical History  Diagnosis Date  . Back pain     History reviewed. No pertinent past surgical history.  History reviewed. No pertinent family history.  History  Substance Use Topics  . Smoking status: Former Games developer  . Smokeless tobacco: Not on file  . Alcohol Use: Yes      Review of Systems  Skin: Positive for rash.  All other systems reviewed and are negative.    Allergies  Duloxetine  Home Medications   Current Outpatient Rx  Name Route Sig Dispense Refill  . ASPIRIN 81 MG PO TABS Oral Take 81 mg by mouth daily.      Marland Kitchen FINASTERIDE 5 MG PO TABS Oral Take 5 mg by mouth daily.      Marland Kitchen LISINOPRIL 10 MG PO TABS Oral Take 40 mg by mouth daily.     Marland Kitchen PREDNISONE (PAK) 10 MG PO TABS Oral Take 1 tablet (10 mg total) by mouth daily. 6 day pack, use as directed, Disp 1 pack 21 tablet 0    BP 142/85  Pulse 73  Temp(Src) 98.5 F (36.9 C) (Oral)  Resp 16  Ht 5\' 7"  (1.702 m)  Wt 186 lb  (84.369 kg)  BMI 29.13 kg/m2  SpO2 97%  Physical Exam  Nursing note and vitals reviewed. Constitutional: He is oriented to person, place, and time. He appears well-developed and well-nourished.  HENT:  Head: Normocephalic and atraumatic.  Eyes: No scleral icterus.  Neck: Neck supple.  Cardiovascular: Regular rhythm and normal heart sounds.   Pulmonary/Chest: Effort normal and breath sounds normal. No respiratory distress.  Neurological: He is alert and oriented to person, place, and time.  Skin: Skin is warm and dry.       Scattered urticarial maculopapular rash on his abdomen trunk, arms, back.  No signs of bacterial superinfection, no dermatomal distribution, no bleeding, no drainage, no tenderness.  Psychiatric: He has a normal mood and affect. His speech is normal.    ED Course  Procedures (including critical care time)  Labs Reviewed - No data to display No results found.   1. Contact dermatitis       MDM   This appears to be a contact or an atopic dermatitis.  We gave him a shot of Solu-Medrol in clinic and given him a prescription for prednisone that he will start tomorrow.  Cool compresses and Benadryl as needed.  Avoid further contact  with poison ivy or other irritants.  Followup with her PCP or dermatologist if not improving.  Marlaine Hind, MD 03/13/12 Ernest[facility] [place]

## 2012-03-13 NOTE — ED Notes (Signed)
Pt c/o rash on back, chest and arms x today. He has taken benadryl with some relief.

## 2013-02-09 ENCOUNTER — Ambulatory Visit (INDEPENDENT_AMBULATORY_CARE_PROVIDER_SITE_OTHER): Payer: Medicare Other

## 2013-02-09 ENCOUNTER — Other Ambulatory Visit: Payer: Self-pay | Admitting: Orthopedic Surgery

## 2013-02-09 DIAGNOSIS — M545 Low back pain: Secondary | ICD-10-CM

## 2013-02-09 DIAGNOSIS — M5380 Other specified dorsopathies, site unspecified: Secondary | ICD-10-CM

## 2015-05-09 ENCOUNTER — Emergency Department (INDEPENDENT_AMBULATORY_CARE_PROVIDER_SITE_OTHER)
Admission: EM | Admit: 2015-05-09 | Discharge: 2015-05-09 | Disposition: A | Discharge status: Home / Self Care | Payer: Medicare Other | Source: Home / Self Care | Attending: Family Medicine | Admitting: Family Medicine

## 2015-05-09 ENCOUNTER — Encounter: Payer: Self-pay | Admitting: *Deleted

## 2015-05-09 DIAGNOSIS — W57XXXA Bitten or stung by nonvenomous insect and other nonvenomous arthropods, initial encounter: Secondary | ICD-10-CM

## 2015-05-09 DIAGNOSIS — T148 Other injury of unspecified body region: Secondary | ICD-10-CM | POA: Diagnosis not present

## 2015-05-09 HISTORY — DX: Essential (primary) hypertension: I10

## 2015-05-09 HISTORY — DX: Hyperlipidemia, unspecified: E78.5

## 2015-05-09 NOTE — Discharge Instructions (Signed)
Change bandage daily and apply Bacitracin ointment until healed.   Tick Bite Information Ticks are insects that attach themselves to the skin and draw blood for food. There are various types of ticks. Common types include wood ticks and deer ticks. Most ticks live in shrubs and grassy areas. Ticks can climb onto your body when you make contact with leaves or grass where the tick is waiting. The most common places on the body for ticks to attach themselves are the scalp, neck, armpits, waist, and groin. Most tick bites are harmless, but sometimes ticks carry germs that cause diseases. These germs can be spread to a person during the tick's feeding process. The chance of a disease spreading through a tick bite depends on:   The type of tick.  Time of year.   How long the tick is attached.   Geographic location.  HOW CAN YOU PREVENT TICK BITES? Take these steps to help prevent tick bites when you are outdoors:  Wear protective clothing. Long sleeves and long pants are best.   Wear white clothes so you can see ticks more easily.  Tuck your pant legs into your socks.   If walking on a trail, stay in the middle of the trail to avoid brushing against bushes.  Avoid walking through areas with long grass.  Put insect repellent on all exposed skin and along boot tops, pant legs, and sleeve cuffs.   Check clothing, hair, and skin repeatedly and before going inside.   Brush off any ticks that are not attached.  Take a shower or bath as soon as possible after being outdoors.  WHAT IS THE PROPER WAY TO REMOVE A TICK? Ticks should be removed as soon as possible to help prevent diseases caused by tick bites. 1. If latex gloves are available, put them on before trying to remove a tick.  2. Using fine-point tweezers, grasp the tick as close to the skin as possible. You may also use curved forceps or a tick removal tool. Grasp the tick as close to its head as possible. Avoid grasping the  tick on its body. 3. Pull gently with steady upward pressure until the tick lets go. Do not twist the tick or jerk it suddenly. This may break off the tick's head or mouth parts. 4. Do not squeeze or crush the tick's body. This could force disease-carrying fluids from the tick into your body.  5. After the tick is removed, wash the bite area and your hands with soap and water or other disinfectant such as alcohol. 6. Apply a small amount of antiseptic cream or ointment to the bite site.  7. Wash and disinfect any instruments that were used.  Do not try to remove a tick by applying a hot match, petroleum jelly, or fingernail polish to the tick. These methods do not work and may increase the chances of disease being spread from the tick bite.  WHEN SHOULD YOU SEEK MEDICAL CARE? Contact your health care provider if you are unable to remove a tick from your skin or if a part of the tick breaks off and is stuck in the skin.  After a tick bite, you need to be aware of signs and symptoms that could be related to diseases spread by ticks. Contact your health care provider if you develop any of the following in the days or weeks after the tick bite:  Unexplained fever.  Rash. A circular rash that appears days or weeks after the tick bite  may indicate the possibility of Lyme disease. The rash may resemble a target with a bull's-eye and may occur at a different part of your body than the tick bite.  Redness and swelling in the area of the tick bite.   Tender, swollen lymph glands.   Diarrhea.   Weight loss.   Cough.   Fatigue.   Muscle, joint, or bone pain.   Abdominal pain.   Headache.   Lethargy or a change in your level of consciousness.  Difficulty walking or moving your legs.   Numbness in the legs.   Paralysis.  Shortness of breath.   Confusion.   Repeated vomiting.  Document Released: 11/23/2000 Document Revised: 09/16/2013 Document Reviewed:  05/06/2013 [facility] Patient Information 2015 [place], [place]. This information is not intended to replace advice given to you by your health care provider. Make sure you discuss any questions you have with your health care provider.

## 2015-05-09 NOTE — ED Provider Notes (Signed)
CSN: [id number]     Arrival date & time 05/09/15  1159 History   First MD Initiated Contact with Patient 05/09/15 1230     Chief Complaint  Patient presents with  . Tick Removal      HPI Comments: Patient discovered an embedded tick on his left buttock yesterday.  The tick had not been present more than 24 hours.  Patient feels well otherwise.  No fevers, chills, and sweats   The history is provided by the patient.    Past Medical History  Diagnosis Date  . Back pain   . Hypertension   . Hyperlipidemia    Past Surgical History  Procedure Laterality Date  . Shoulder arthroscopy Left   . Neck surgery    . Appendectomy    . Skin graft     Family History  Problem Relation Age of Onset  . Stroke Father   . Heart attack Father    History  Substance Use Topics  . Smoking status: Former Research scientist (life sciences)  . Smokeless tobacco: Not on file  . Alcohol Use: Yes    Review of Systems  Constitutional: Negative for fever, chills, diaphoresis and fatigue.  HENT: Negative.   Eyes: Negative.   Respiratory: Negative.   Cardiovascular: Negative.   Gastrointestinal: Negative.   Genitourinary: Negative.   Musculoskeletal: Negative for myalgias, joint swelling and neck pain.  Skin:       Tick bite  Neurological: Negative for headaches.    Allergies  Cymbalta and Duloxetine  Home Medications   Prior to Admission medications   Medication Sig Start Date End Date Taking? Authorizing Provider  losartan (COZAAR) 50 MG tablet Take 50 mg by mouth daily.   Yes Historical Provider, MD  omeprazole (PRILOSEC) 20 MG capsule Take 20 mg by mouth daily.   Yes Historical Provider, MD  pravastatin (PRAVACHOL) 40 MG tablet Take 40 mg by mouth daily.   Yes Historical Provider, MD  aspirin 81 MG tablet Take 81 mg by mouth daily.      Historical Provider, MD  finasteride (PROSCAR) 5 MG tablet Take 5 mg by mouth daily.      Historical Provider, MD  lisinopril (PRINIVIL,ZESTRIL) 10 MG tablet Take 40 mg by mouth  daily.     Historical Provider, MD   BP 175/93 mmHg  Pulse 60  Temp(Src) 98.2 F (36.8 C) (Oral)  Resp 16  Ht 5\' 7"  (1.702 m)  Wt 188 lb (85.276 kg)  BMI 29.44 kg/m2  SpO2 98% Physical Exam  Constitutional: He appears well-developed and well-nourished. No distress.  HENT:  Head: Normocephalic.  Eyes: Pupils are equal, round, and reactive to light.  Neck: Neck supple.  Skin: No rash noted.     On the patient's left buttock, as noted on diagram, there is an embedded, non-engorged 9mm diameter tick.  No surrounding erythema or tenderness.    Nursing note and vitals reviewed.   ED Course  Procedures  Tick Removal:   Discussed benefits and risks of procedure and verbal consent obtained. Using sterile technique and local 1% lidocaine with epinephrine, cleansed wound with Betadine followed by alcohol. Easily removed tick with needle carrier.  Debrided tick mouth parts with #15 blade.  Bacitracin and non-stick sterile dressing applied.  Wound precautions explained to patient.     MDM   1. Tick bite with subsequent removal of tick     Change bandage daily and apply Bacitracin ointment until healed.  Discussed wound precautions Tick bite information given.  Kandra Nicolas, MD 05/09/15 1318

## 2015-05-09 NOTE — ED Notes (Signed)
Pt c/o tick bite to his LT buttock x 1 day. Denies fever or rash.

## 2015-05-12 ENCOUNTER — Telehealth: Payer: Self-pay | Admitting: Emergency Medicine

## 2017-01-20 ENCOUNTER — Emergency Department (INDEPENDENT_AMBULATORY_CARE_PROVIDER_SITE_OTHER): Payer: Medicare Other

## 2017-01-20 ENCOUNTER — Encounter: Payer: Self-pay | Admitting: Emergency Medicine

## 2017-01-20 ENCOUNTER — Emergency Department
Admission: EM | Admit: 2017-01-20 | Discharge: 2017-01-20 | Disposition: A | Discharge status: Home / Self Care | Payer: Medicare Other | Source: Home / Self Care | Attending: Family Medicine | Admitting: Family Medicine

## 2017-01-20 DIAGNOSIS — M7989 Other specified soft tissue disorders: Secondary | ICD-10-CM

## 2017-01-20 DIAGNOSIS — S90221A Contusion of right lesser toe(s) with damage to nail, initial encounter: Secondary | ICD-10-CM

## 2017-01-20 DIAGNOSIS — M79674 Pain in right toe(s): Secondary | ICD-10-CM

## 2017-01-20 DIAGNOSIS — M79671 Pain in right foot: Secondary | ICD-10-CM

## 2017-01-20 MED ORDER — NAPROXEN 500 MG PO TABS
500.0000 mg | ORAL_TABLET | Freq: Two times a day (BID) | ORAL | 0 refills | Status: DC
Start: 2017-01-20 — End: 2020-09-03

## 2017-01-20 MED ORDER — CEPHALEXIN 500 MG PO CAPS: 500.0000 mg | ORAL_CAPSULE | Freq: Two times a day (BID) | ORAL | 0 refills | Status: DC

## 2017-01-20 MED ORDER — PREDNISONE 20 MG PO TABS: ORAL_TABLET | ORAL | 0 refills | Status: DC

## 2017-01-20 NOTE — ED Provider Notes (Signed)
CSN: [id number]     Arrival date & time 01/20/17  1423 History   First MD Initiated Contact with Patient 01/20/17 1505     Chief Complaint  Patient presents with  . Foot Pain   (Consider location/radiation/quality/duration/timing/severity/associated sxs/prior Treatment) HPI Kenneth Gray is a 66 y.o. male presenting to UC with c/o 3 days of gradually worsening Right foot pain that started initially with his middle toe being bruised. He does not recall any injury but wonders if he was bit by an insect. Yesterday, he developed redness and warmth to the top of his foot and is concerned it may be gout, however, no prior hx of gout.  Pain is aching and sore, worse with ambulation and light touch.  He did get some relief with ibuprofen and elevation yesterday. Denies fever, chills, n/v/d.    Past Medical History:  Diagnosis Date  . Back pain   . Hyperlipidemia   . Hypertension    Past Surgical History:  Procedure Laterality Date  . APPENDECTOMY    . NECK SURGERY    . SHOULDER ARTHROSCOPY Left   . SKIN GRAFT     Family History  Problem Relation Age of Onset  . Stroke Father   . Heart attack Father    Social History  Substance Use Topics  . Smoking status: Former Research scientist (life sciences)  . Smokeless tobacco: Never Used  . Alcohol use Yes    Review of Systems  Constitutional: Negative for chills and fever.  Gastrointestinal: Negative for nausea and vomiting.  Musculoskeletal: Positive for arthralgias, joint swelling and myalgias.       Right foot  Skin: Positive for color change. Negative for wound.  Neurological: Negative for weakness and numbness.    Allergies  Cymbalta [duloxetine hcl] and Duloxetine  Home Medications   Prior to Admission medications   Medication Sig Start Date End Date Taking? Authorizing Provider  aspirin 81 MG tablet Take 81 mg by mouth daily.      Historical Provider, MD  cephALEXin (KEFLEX) 500 MG capsule Take 1 capsule (500 mg total) by mouth 2 (two) times  daily. For 7 days 01/20/17   Noland Fordyce, PA-C  finasteride (PROSCAR) 5 MG tablet Take 5 mg by mouth daily.      Historical Provider, MD  lisinopril (PRINIVIL,ZESTRIL) 10 MG tablet Take 40 mg by mouth daily.     Historical Provider, MD  losartan (COZAAR) 50 MG tablet Take 50 mg by mouth daily.    Historical Provider, MD  naproxen (NAPROSYN) 500 MG tablet Take 1 tablet (500 mg total) by mouth 2 (two) times daily. 01/20/17   Noland Fordyce, PA-C  omeprazole (PRILOSEC) 20 MG capsule Take 20 mg by mouth daily.    Historical Provider, MD  pravastatin (PRAVACHOL) 40 MG tablet Take 40 mg by mouth daily.    Historical Provider, MD  predniSONE (DELTASONE) 20 MG tablet 3 tabs po day one, then 2 po daily x 4 days 01/20/17   Noland Fordyce, PA-C   Meds Ordered and Administered this Visit  Medications - No data to display  BP 143/85 (BP Location: Left Arm)   Pulse 66   Temp 98.1 F (36.7 C) (Oral)   Resp 16   Ht 5\' 7"  (1.702 m)   Wt 182 lb 4 oz (82.7 kg)   SpO2 95%   BMI 28.54 kg/m  No data found.   Physical Exam  Constitutional: He is oriented to person, place, and time. He appears well-developed and well-nourished. No  distress.  HENT:  Head: Normocephalic and atraumatic.  Mouth/Throat: Oropharynx is clear and moist.  Eyes: EOM are normal.  Neck: Normal range of motion.  Cardiovascular: Normal rate.   Pulmonary/Chest: Effort normal. No respiratory distress.  Musculoskeletal: Normal range of motion. He exhibits edema and tenderness.  Right foot, dorsal aspect over 1st-3rd metatarsals- mild edema and tenderness with light touch. Full ROM ankle and toes. (see skin exam)  Neurological: He is alert and oriented to person, place, and time.  Skin: Skin is warm and dry. Capillary refill takes less than 2 seconds. Bruising noted. He is not diaphoretic. There is erythema.     Right foot, dorsal aspect: 3rd toe- ecchymosis, mildly tender. Erythema, warmth and tenderness with light touch over 1st-3rd  distal metatarsals. Skin in tact.   Psychiatric: He has a normal mood and affect. His behavior is normal.  Nursing note and vitals reviewed.   Urgent Care Course     Procedures (including critical care time)  Labs Review Labs Reviewed - No data to display  Imaging Review Dg Foot Complete Right  Result Date: 01/20/2017 CLINICAL DATA:  Pain and swelling of the right foot EXAM: RIGHT FOOT COMPLETE - 3+ VIEW COMPARISON:  None. FINDINGS: There is no evidence of acute fracture or dislocation. There is a bipartite lateral hallux sesamoid. There is a well corticated bony fragment adjacent to the lateral base of the fifth metatarsal likely reflecting an accessory ossicle or sequela prior injury. Soft tissues are unremarkable. IMPRESSION: No acute osseous injury of the right foot. Electronically Signed   By: Kathreen Devoid   On: 01/20/2017 15:28      MDM   1. Pain and swelling of toe of right foot   2. Toenail bruise, right, initial encounter    Exam c/w gout, however, due to ecchymosis of 3rd toe w/o known injury and no evidence of fracture on imaging, question possible insect bite and secondary cellulitis.    Will cover for infection while treating for potential gout. Pt cannot take colchicine as he is currently taking a statin drug for high cholesterol.  Rx: Keflex, naproxen, and prednisone for 5 days. Encouraged f/u with PCP by the end of the week for recheck of symptoms. Patient verbalized understanding and agreement with treatment plan.      Noland Fordyce, PA-C 01/20/17 1727

## 2017-01-20 NOTE — ED Triage Notes (Signed)
Patient presents to [facility] with C/O pain and edema in the right foot and the first 3 toes. Denies injury unsure of insect bite.

## 2020-09-02 ENCOUNTER — Other Ambulatory Visit: Payer: Self-pay

## 2020-09-02 ENCOUNTER — Emergency Department (HOSPITAL_COMMUNITY): Payer: No Typology Code available for payment source

## 2020-09-02 ENCOUNTER — Emergency Department (HOSPITAL_COMMUNITY)
Admission: EM | Admit: 2020-09-02 | Discharge: 2020-09-03 | Disposition: A | Discharge status: Home / Self Care | Payer: No Typology Code available for payment source | Attending: Emergency Medicine | Admitting: Emergency Medicine

## 2020-09-02 ENCOUNTER — Encounter (HOSPITAL_COMMUNITY): Payer: Self-pay | Admitting: Emergency Medicine

## 2020-09-02 DIAGNOSIS — M542 Cervicalgia: Secondary | ICD-10-CM | POA: Diagnosis not present

## 2020-09-02 DIAGNOSIS — M25512 Pain in left shoulder: Secondary | ICD-10-CM | POA: Diagnosis not present

## 2020-09-02 DIAGNOSIS — N182 Chronic kidney disease, stage 2 (mild): Secondary | ICD-10-CM | POA: Diagnosis not present

## 2020-09-02 DIAGNOSIS — Z87891 Personal history of nicotine dependence: Secondary | ICD-10-CM | POA: Insufficient documentation

## 2020-09-02 DIAGNOSIS — R1011 Right upper quadrant pain: Secondary | ICD-10-CM | POA: Insufficient documentation

## 2020-09-02 DIAGNOSIS — Z79899 Other long term (current) drug therapy: Secondary | ICD-10-CM | POA: Insufficient documentation

## 2020-09-02 DIAGNOSIS — Z7982 Long term (current) use of aspirin: Secondary | ICD-10-CM | POA: Insufficient documentation

## 2020-09-02 DIAGNOSIS — R519 Headache, unspecified: Secondary | ICD-10-CM | POA: Diagnosis not present

## 2020-09-02 DIAGNOSIS — R0782 Intercostal pain: Secondary | ICD-10-CM | POA: Insufficient documentation

## 2020-09-02 DIAGNOSIS — Z96612 Presence of left artificial shoulder joint: Secondary | ICD-10-CM | POA: Diagnosis not present

## 2020-09-02 DIAGNOSIS — S63074A Dislocation of distal end of right ulna, initial encounter: Secondary | ICD-10-CM

## 2020-09-02 DIAGNOSIS — R911 Solitary pulmonary nodule: Secondary | ICD-10-CM

## 2020-09-02 DIAGNOSIS — I129 Hypertensive chronic kidney disease with stage 1 through stage 4 chronic kidney disease, or unspecified chronic kidney disease: Secondary | ICD-10-CM | POA: Insufficient documentation

## 2020-09-02 DIAGNOSIS — S139XXA Sprain of joints and ligaments of unspecified parts of neck, initial encounter: Secondary | ICD-10-CM

## 2020-09-02 LAB — CBC
HCT: 44.1 % (ref 39.0–52.0)
Hemoglobin: 15 g/dL (ref 13.0–17.0)
MCH: 30.6 pg (ref 26.0–34.0)
MCHC: 34 g/dL (ref 30.0–36.0)
MCV: 90 fL (ref 80.0–100.0)
Platelets: 282 10*3/uL (ref 150–400)
RBC: 4.9 MIL/uL (ref 4.22–5.81)
RDW: 12.7 % (ref 11.5–15.5)
WBC: 10.9 10*3/uL — ABNORMAL HIGH (ref 4.0–10.5)
nRBC: 0 % (ref 0.0–0.2)

## 2020-09-02 LAB — BASIC METABOLIC PANEL
Anion gap: 11 (ref 5–15)
BUN: 19 mg/dL (ref 8–23)
CO2: 24 mmol/L (ref 22–32)
Calcium: 8.8 mg/dL — ABNORMAL LOW (ref 8.9–10.3)
Chloride: 103 mmol/L (ref 98–111)
Creatinine, Ser: 0.89 mg/dL (ref 0.61–1.24)
GFR calc Af Amer: >60 mL/min (ref >60)
GFR calc non Af Amer: >60 mL/min (ref >60)
Glucose, Bld: 143 mg/dL — ABNORMAL HIGH (ref 70–99)
Potassium: 3.5 mmol/L (ref 3.5–5.1)
Sodium: 138 mmol/L (ref 135–145)

## 2020-09-02 MED ORDER — ACETAMINOPHEN 325 MG PO TABS
650.0000 mg | ORAL_TABLET | Freq: Once | ORAL | Status: AC
  Administered 2020-09-02: 650 mg via ORAL
  Filled 2020-09-02: qty 2

## 2020-09-02 MED ORDER — IOHEXOL 300 MG/ML  SOLN
100.0000 mL | Freq: Once | INTRAMUSCULAR | Status: AC | PRN
  Administered 2020-09-03: 100 mL via INTRAVENOUS

## 2020-09-02 NOTE — ED Notes (Signed)
Report to Dellis Filbert , [place]
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
## 2020-09-02 NOTE — ED Notes (Signed)
Lab in to draw

## 2020-09-03 ENCOUNTER — Emergency Department (HOSPITAL_COMMUNITY): Payer: No Typology Code available for payment source

## 2020-09-03 DIAGNOSIS — R519 Headache, unspecified: Secondary | ICD-10-CM | POA: Diagnosis not present

## 2020-09-03 MED ORDER — NAPROXEN 500 MG PO TABS: 500.0000 mg | ORAL_TABLET | Freq: Two times a day (BID) | ORAL | 0 refills | Status: AC

## 2020-09-03 MED ORDER — HYDROCODONE-ACETAMINOPHEN 5-325 MG PO TABS
1.0000 | ORAL_TABLET | ORAL | 0 refills | Status: AC | PRN
Start: 2020-09-03 — End: ?

## 2020-09-03 NOTE — ED Notes (Addendum)
Pt provided discharge instruction and used verbal teach-back to show understanding. Per discharge prescription, Pt provided Pre-Pack of Norco.
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
## 2020-09-03 NOTE — ED Notes (Signed)
Pt transported to radiology.

## 2020-09-03 NOTE — Discharge Instructions (Addendum)
Wear the wrist brace until you see the hand specialist.  Apply ice to sore areas as needed.  You may take acetaminophen along with the naproxen for better pain relief - reserve hydrocodone-acetaminophen for severe pain.  Your CT scan showed a nodule of your left lung. The radiologist recommends you have a repeat CT scan in three months. Your primary care provider can schedule that.

## 2020-09-05 MED FILL — Hydrocodone-Acetaminophen Tab 5-325 MG: ORAL | Qty: 6 | Status: AC
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
